# Patient Record
Sex: Female | Born: 1955 | Race: Black or African American | Hispanic: No | Marital: Single | Smoking: Current every day smoker
Health system: Southern US, Community
[De-identification: ages and names within clinical notes are randomized; demographics above are authoritative.]

## PROBLEM LIST (undated history)

## (undated) DIAGNOSIS — M1990 Unspecified osteoarthritis, unspecified site: Secondary | ICD-10-CM

## (undated) HISTORY — PX: BARIATRIC SURGERY: SHX1103

---

## 2020-02-21 ENCOUNTER — Ambulatory Visit (INDEPENDENT_AMBULATORY_CARE_PROVIDER_SITE_OTHER): Payer: 59

## 2020-02-21 ENCOUNTER — Encounter (HOSPITAL_COMMUNITY): Payer: Self-pay | Admitting: Emergency Medicine

## 2020-02-21 ENCOUNTER — Ambulatory Visit (HOSPITAL_COMMUNITY)
Admission: EM | Admit: 2020-02-21 | Discharge: 2020-02-21 | Disposition: A | Discharge status: Home / Self Care | Payer: 59

## 2020-02-21 ENCOUNTER — Other Ambulatory Visit: Payer: Self-pay

## 2020-02-21 DIAGNOSIS — W19XXXA Unspecified fall, initial encounter: Secondary | ICD-10-CM

## 2020-02-21 DIAGNOSIS — R2233 Localized swelling, mass and lump, upper limb, bilateral: Secondary | ICD-10-CM | POA: Diagnosis not present

## 2020-02-21 DIAGNOSIS — M25422 Effusion, left elbow: Secondary | ICD-10-CM

## 2020-02-21 DIAGNOSIS — M79602 Pain in left arm: Secondary | ICD-10-CM | POA: Diagnosis not present

## 2020-02-21 DIAGNOSIS — M25522 Pain in left elbow: Secondary | ICD-10-CM | POA: Diagnosis not present

## 2020-02-21 HISTORY — DX: Unspecified osteoarthritis, unspecified site: M19.90

## 2020-02-21 MED ORDER — NAPROXEN 500 MG PO TABS: 500.0000 mg | ORAL_TABLET | Freq: Two times a day (BID) | ORAL | 0 refills | Status: AC

## 2020-02-21 MED ORDER — HYDROCODONE-ACETAMINOPHEN 5-325 MG PO TABS: 1.0000 | ORAL_TABLET | ORAL | 0 refills | Status: AC | PRN

## 2020-02-21 MED ORDER — CYCLOBENZAPRINE HCL 10 MG PO TABS
10.0000 mg | ORAL_TABLET | Freq: Two times a day (BID) | ORAL | 0 refills | Status: AC | PRN
Start: 2020-02-21 — End: ?

## 2020-02-21 NOTE — ED Provider Notes (Addendum)
[facility]   [id number] 02/21/20 Arrival Time: 1448  JE:HUDJS PAIN  SUBJECTIVE: History from: patient. Courtney Walters is a 64 y.o. female complains of left elbow and forearm pain.  Reports that she fell while walking on [address] yesterday.  Reports that the pain was so bad last night that she was crying in tears.  Describes the pain as constant and throbbing and aching character.  Reports that she cannot straighten out left elbow.  Symptoms made worse with activity and palpation.  Reports that she cannot grip anything with her left hand as it makes the pain in her left forearm and elbow worse.  Denies similar symptoms in the past.  Denies fever, chills, erythema, ecchymosis,  numbness and tingling, saddle paresthesias, loss of bowel or bladder function.      ROS: As per HPI.  All other pertinent ROS negative.     Past Medical History:  Diagnosis Date  . Arthritis    Past Surgical History:  Procedure Laterality Date  . BARIATRIC SURGERY     No Known Allergies No current facility-administered medications on file prior to encounter.   Current Outpatient Medications on File Prior to Encounter  Medication Sig Dispense Refill  . celecoxib (CELEBREX) 400 MG capsule Take 400 mg by mouth as needed for pain.    . cetirizine (ZYRTEC) 10 MG tablet Take 10 mg by mouth daily.    . Multiple Vitamins-Minerals (MULTIVITAMIN WITH MINERALS) tablet Take 1 tablet by mouth daily.     Social History   Socioeconomic History  . Marital status: Single    Spouse name: Not on file  . Number of children: Not on file  . Years of education: Not on file  . Highest education level: Not on file  Occupational History  . Not on file  Tobacco Use  . Smoking status: Current Every Day Smoker  . Smokeless tobacco: Never Used  Substance and Sexual Activity  . Alcohol use: Not Currently  . Drug use: Never  . Sexual activity: Not on file  Other Topics Concern  . Not on file  Social History  Narrative  . Not on file   Social Determinants of Health   Financial Resource Strain:   . Difficulty of Paying Living Expenses:   Food Insecurity:   . Worried About Programme researcher, broadcasting/film/video in the Last Year:   . Barista in the Last Year:   Transportation Needs:   . Freight forwarder (Medical):   Marland Kitchen Lack of Transportation (Non-Medical):   Physical Activity:   . Days of Exercise per Week:   . Minutes of Exercise per Session:   Stress:   . Feeling of Stress :   Social Connections:   . Frequency of Communication with Friends and Family:   . Frequency of Social Gatherings with Friends and Family:   . Attends Religious Services:   . Active Member of Clubs or Organizations:   . Attends Banker Meetings:   Marland Kitchen Marital Status:   Intimate Partner Violence:   . Fear of Current or Ex-Partner:   . Emotionally Abused:   Marland Kitchen Physically Abused:   . Sexually Abused:    History reviewed. No pertinent family history.  OBJECTIVE:  Vitals:   02/21/20 1014  BP: 115/68  Pulse: 71  Resp: 18  Temp: 98.2 F (36.8 C)  TempSrc: Oral  SpO2: 100%    General appearance: ALERT; in no acute distress.  Head: NCAT Lungs: Normal respiratory effort  CV: radial pulses 2+ bilaterally. Cap refill < 2 seconds Musculoskeletal:  Inspection: Skin warm, dry, clear and intact without obvious erythema, or ecchymosis; swelling to medial aspect of left elbow Palpation: Left elbow and anterior aspect of left forearm tender to palpation ROM:  Limited ROM active and passive to L elbow Skin: warm and dry Neurologic: Ambulates without difficulty; Sensation intact about the upper/ lower extremities Psychological: alert and cooperative; normal mood and affect  DIAGNOSTIC STUDIES:  No results found.   ASSESSMENT & PLAN:  1. Left arm pain   2. Fall, initial encounter   3. Elbow joint effusion, left     Meds ordered this encounter  Medications  . naproxen (NAPROSYN) 500 MG tablet    Sig:  Take 1 tablet (500 mg total) by mouth 2 (two) times daily.    Dispense:  30 tablet    Refill:  0    Order Specific Question:   Supervising Provider    Answer:   Merrilee Jansky [id number]  . cyclobenzaprine (FLEXERIL) 10 MG tablet    Sig: Take 1 tablet (10 mg total) by mouth 2 (two) times daily as needed for muscle spasms.    Dispense:  20 tablet    Refill:  0    Order Specific Question:   Supervising Provider    Answer:   Merrilee Jansky [id number]  . HYDROcodone-acetaminophen (NORCO/VICODIN) 5-325 MG tablet    Sig: Take 1 tablet by mouth every 4 (four) hours as needed for moderate pain or severe pain.    Dispense:  15 tablet    Refill:  0    Order Specific Question:   Supervising Provider    Answer:   Merrilee Jansky [[id number]]   X-ray shows effusion of left elbow, possible nondisplaced fracture, radiology suggested further imaging for diagnosis confirmation Sling applied in office Pt left before ortho tech arrived to apply splint States that she will follow up with orthopedics Continue conservative management of rest, ice, and gentle stretches Take naproxen as needed for pain relief (may cause abdominal discomfort, ulcers, and GI bleeds avoid taking with other NSAIDs) Take cyclobenzaprine at nighttime for symptomatic relief. Avoid driving or operating heavy machinery while using medication. Take the Norco for breakthrough pain Follow up with PCP if symptoms persist Return or go to the ER if you have any new or worsening symptoms (fever, chills, chest pain, abdominal pain, changes in bowel or bladder habits, pain radiating into lower legs)   [place] Controlled Substances Registry consulted for this patient. I feel the risk/benefit ratio today is favorable for proceeding with this prescription for a controlled substance. Medication sedation precautions given.  Reviewed expectations re: course of current medical issues. Questions answered. Outlined signs and symptoms indicating need for  more acute intervention. Patient verbalized understanding. After Visit Summary given.       Moshe Cipro, NP 02/24/20 0912    Moshe Cipro, NP 02/24/20 [phone]

## 2020-02-21 NOTE — Discharge Instructions (Addendum)
Take the ibuprofen as prescribed.  Rest and elevate your arm.  Apply ice packs 2-3 times a day for up to 20 minutes each. Wear the sling for comfort while you are up and active  Follow up with your primary care provider or an orthopedist if you symptoms continue or worsen;  Or if you develop new symptoms, such as numbness, tingling, or weakness.

## 2020-02-21 NOTE — ED Triage Notes (Signed)
Pt here for left arm pain after trip and fall yesterday

## 2021-03-19 IMAGING — DX DG ELBOW COMPLETE 3+V*L*
4 series · 4 of 4 positions shown · non-contrast
Comparison: None.

CLINICAL DATA: Fall, pain and swelling, limited range of motion

EXAM:
LEFT ELBOW - COMPLETE 3+ VIEW

[elbow ap]
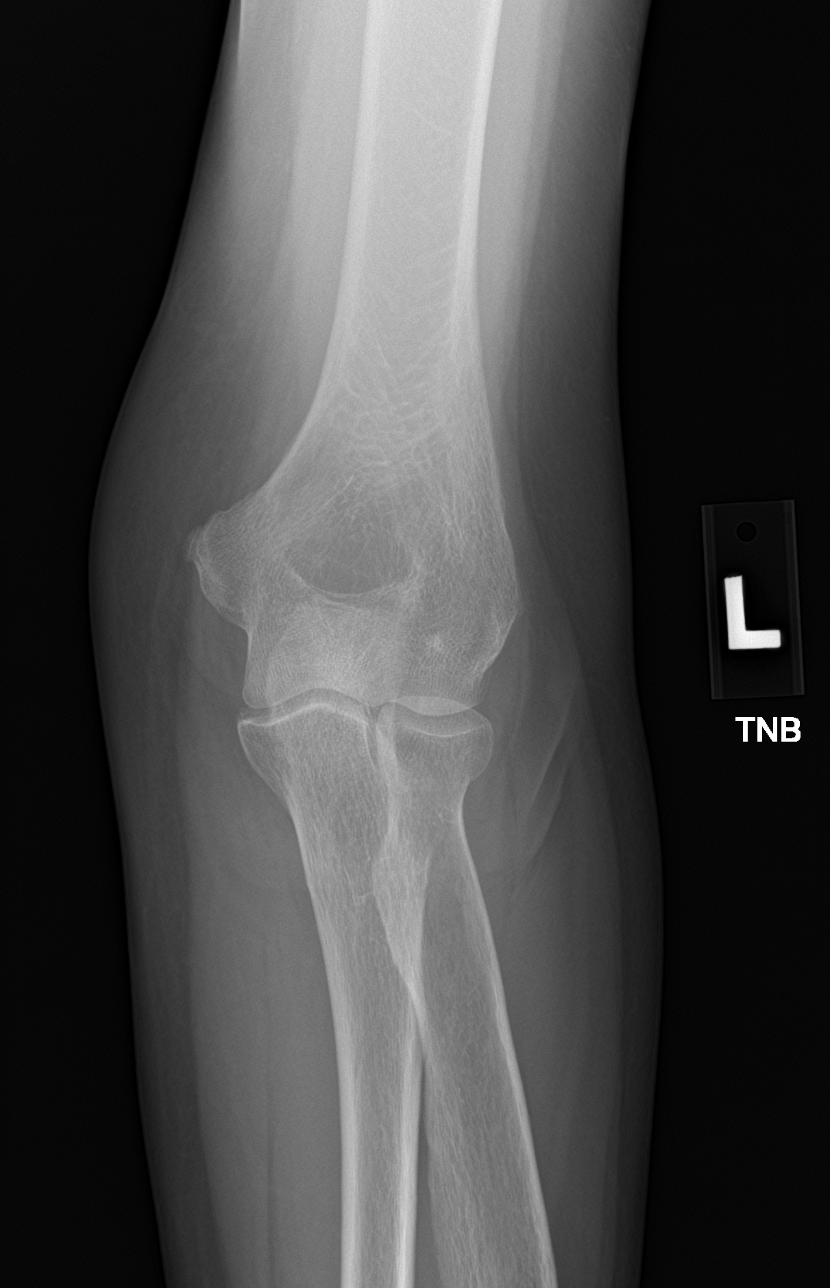

[elbow obl (1 of 2)]
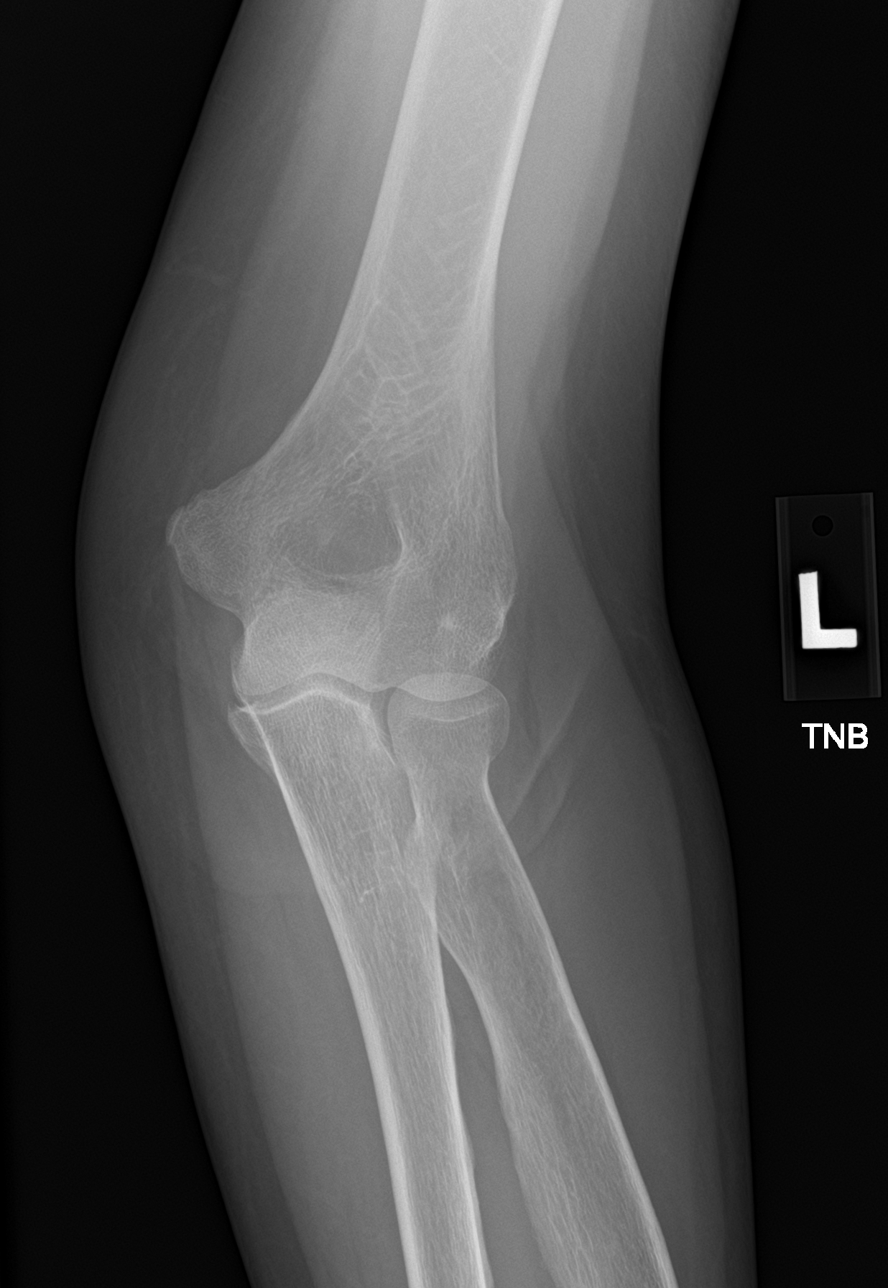

[elbow obl (2 of 2)]
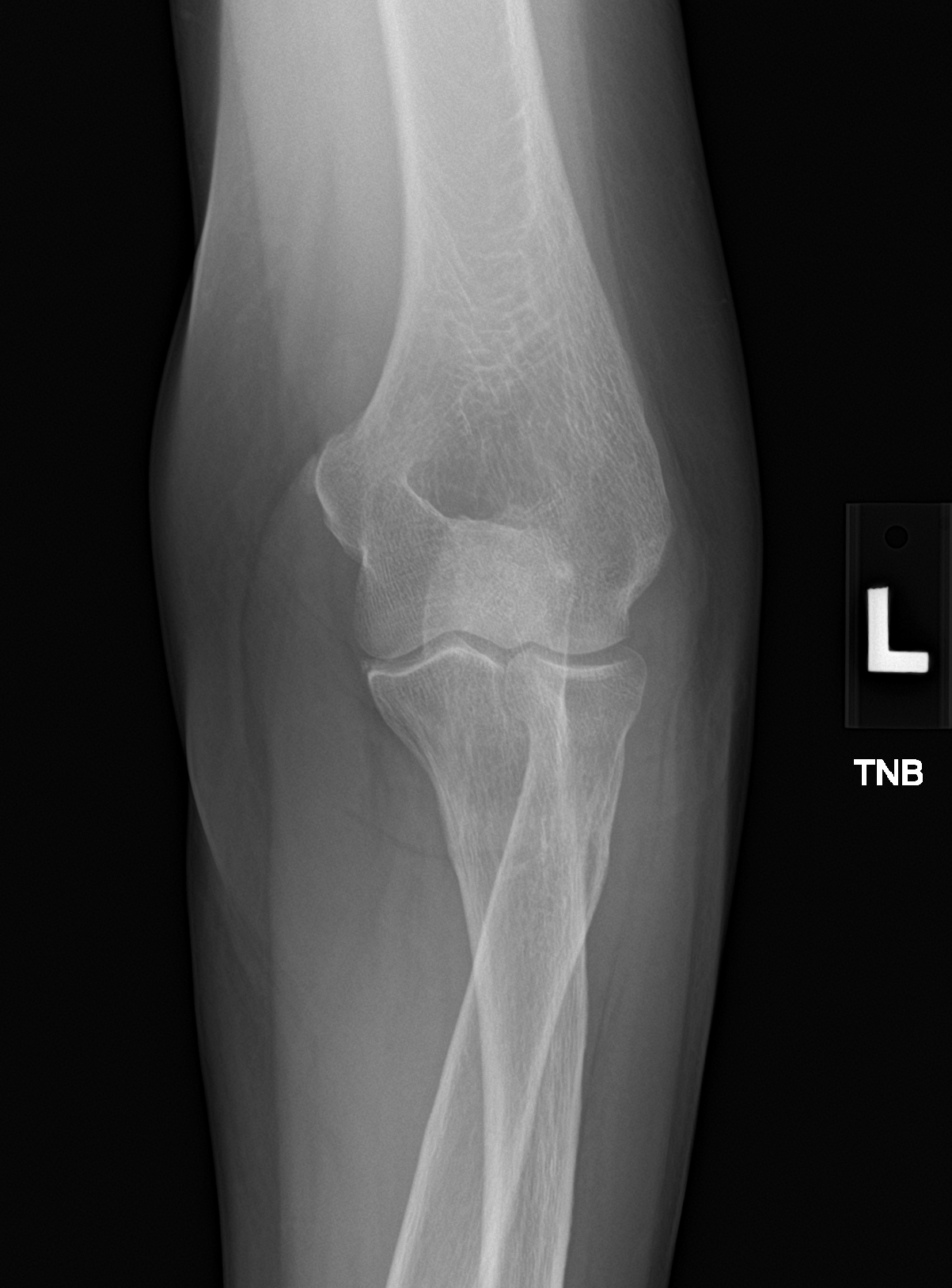

[elbow lat]
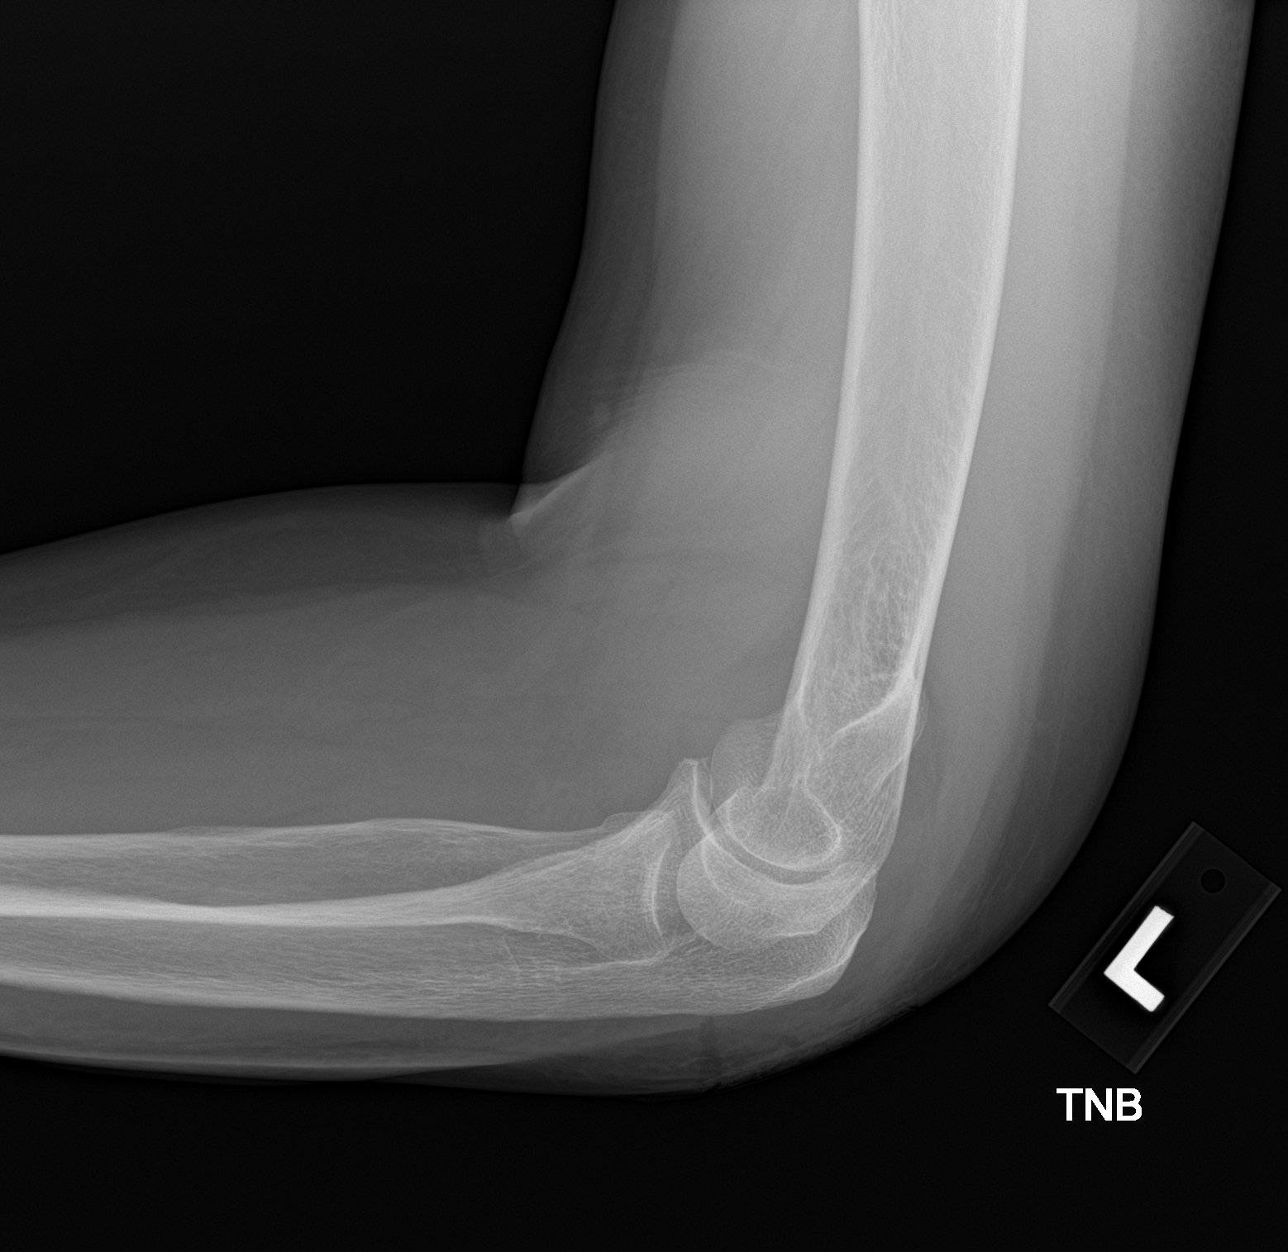

[4 of 4 positions shown; findings below may reference images not displayed]

FINDINGS: No displaced fracture or dislocation of the left elbow. There is a
large elbow joint effusion, suspicious for nondisplaced radial head
or neck fracture. Joint spaces are preserved. Soft tissue edema over
the olecranon.
IMPRESSION: No displaced fracture or dislocation of the left elbow. There is a
large elbow joint effusion, suspicious for nondisplaced radial head
or neck fracture. Immobilization and delayed repeat radiographs in
10-14 days or alternately MRI may be used to further assess for
fracture.
